# Patient Record
Sex: Male | Born: 1965 | Race: Black or African American | Hispanic: No | Marital: Married | Smoking: Current some day smoker
Health system: Southern US, Community
[De-identification: ages and names within clinical notes are randomized; demographics above are authoritative.]

## PROBLEM LIST (undated history)

## (undated) HISTORY — PX: ORTHOPEDIC SURGERY: SHX850

---

## 2018-02-02 ENCOUNTER — Emergency Department (HOSPITAL_BASED_OUTPATIENT_CLINIC_OR_DEPARTMENT_OTHER)
Admission: EM | Admit: 2018-02-02 | Discharge: 2018-02-02 | Discharge status: Against Medical Advice | Payer: Managed Care, Other (non HMO) | Attending: Emergency Medicine | Admitting: Emergency Medicine

## 2018-02-02 ENCOUNTER — Other Ambulatory Visit: Payer: Self-pay

## 2018-02-02 ENCOUNTER — Encounter (HOSPITAL_BASED_OUTPATIENT_CLINIC_OR_DEPARTMENT_OTHER): Payer: Self-pay | Admitting: Emergency Medicine

## 2018-02-02 ENCOUNTER — Emergency Department (HOSPITAL_BASED_OUTPATIENT_CLINIC_OR_DEPARTMENT_OTHER): Payer: Managed Care, Other (non HMO)

## 2018-02-02 DIAGNOSIS — F17210 Nicotine dependence, cigarettes, uncomplicated: Secondary | ICD-10-CM | POA: Insufficient documentation

## 2018-02-02 DIAGNOSIS — E876 Hypokalemia: Secondary | ICD-10-CM | POA: Diagnosis not present

## 2018-02-02 DIAGNOSIS — I501 Left ventricular failure: Secondary | ICD-10-CM

## 2018-02-02 DIAGNOSIS — R0602 Shortness of breath: Secondary | ICD-10-CM

## 2018-02-02 LAB — BASIC METABOLIC PANEL
Anion gap: 9 (ref 5–15)
BUN: 17 mg/dL (ref 6–20)
CALCIUM: 8.7 mg/dL — AB (ref 8.9–10.3)
CO2: 28 mmol/L (ref 22–32)
CREATININE: 0.93 mg/dL (ref 0.61–1.24)
Chloride: 101 mmol/L (ref 98–111)
GFR calc Af Amer: >60 mL/min (ref >60)
Glucose, Bld: 118 mg/dL — ABNORMAL HIGH (ref 70–99)
Potassium: 2.8 mmol/L — ABNORMAL LOW (ref 3.5–5.1)
Sodium: 138 mmol/L (ref 135–145)

## 2018-02-02 LAB — CBC WITH DIFFERENTIAL/PLATELET
Abs Immature Granulocytes: 0.02 10*3/uL (ref 0.00–0.07)
BASOS ABS: 0.1 10*3/uL (ref 0.0–0.1)
Basophils Relative: 1 %
EOS ABS: 0.2 10*3/uL (ref 0.0–0.5)
Eosinophils Relative: 2 %
HEMATOCRIT: 45.1 % (ref 39.0–52.0)
HEMOGLOBIN: 14.2 g/dL (ref 13.0–17.0)
IMMATURE GRANULOCYTES: 0 %
LYMPHS ABS: 1.4 10*3/uL (ref 0.7–4.0)
LYMPHS PCT: 17 %
MCH: 27 pg (ref 26.0–34.0)
MCHC: 31.5 g/dL (ref 30.0–36.0)
MCV: 85.9 fL (ref 80.0–100.0)
Monocytes Absolute: 0.5 10*3/uL (ref 0.1–1.0)
Monocytes Relative: 6 %
NEUTROS PCT: 74 %
Neutro Abs: 6.1 10*3/uL (ref 1.7–7.7)
PLATELETS: 182 10*3/uL (ref 150–400)
RBC: 5.25 MIL/uL (ref 4.22–5.81)
RDW: 13.8 % (ref 11.5–15.5)
WBC: 8.1 10*3/uL (ref 4.0–10.5)
nRBC: 0 % (ref 0.0–0.2)

## 2018-02-02 LAB — TROPONIN I: Troponin I: 0.04 ng/mL (ref <0.03)

## 2018-02-02 LAB — BRAIN NATRIURETIC PEPTIDE: B Natriuretic Peptide: 307.2 pg/mL — ABNORMAL HIGH (ref 0.0–100.0)

## 2018-02-02 LAB — D-DIMER, QUANTITATIVE: D-Dimer, Quant: 1.13 ug/mL-FEU — ABNORMAL HIGH (ref 0.00–0.50)

## 2018-02-02 MED ORDER — ALBUTEROL SULFATE (2.5 MG/3ML) 0.083% IN NEBU
2.5000 mg | INHALATION_SOLUTION | Freq: Once | RESPIRATORY_TRACT | Status: AC
  Administered 2018-02-02: 2.5 mg via RESPIRATORY_TRACT
  Filled 2018-02-02: qty 3

## 2018-02-02 MED ORDER — IOPAMIDOL (ISOVUE-370) INJECTION 76%: 125.0000 mL | Freq: Once | INTRAVENOUS | Status: DC | PRN

## 2018-02-02 MED ORDER — POTASSIUM CHLORIDE CRYS ER 20 MEQ PO TBCR: 20.0000 meq | EXTENDED_RELEASE_TABLET | Freq: Two times a day (BID) | ORAL | 0 refills | Status: AC

## 2018-02-02 MED ORDER — FUROSEMIDE 20 MG PO TABS: 20.0000 mg | ORAL_TABLET | Freq: Every day | ORAL | 0 refills | Status: AC

## 2018-02-02 MED ORDER — POTASSIUM CHLORIDE 20 MEQ/15ML (10%) PO SOLN
20.0000 meq | Freq: Once | ORAL | Status: AC
  Administered 2018-02-02: 20 meq via ORAL
  Filled 2018-02-02: qty 15

## 2018-02-02 MED ORDER — FUROSEMIDE 10 MG/ML IJ SOLN
40.0000 mg | Freq: Once | INTRAMUSCULAR | Status: AC
  Administered 2018-02-02: 40 mg via INTRAVENOUS
  Filled 2018-02-02: qty 4

## 2018-02-02 MED ORDER — POTASSIUM CHLORIDE CRYS ER 20 MEQ PO TBCR
40.0000 meq | EXTENDED_RELEASE_TABLET | Freq: Once | ORAL | Status: AC
  Administered 2018-02-02: 40 meq via ORAL
  Filled 2018-02-02: qty 2

## 2018-02-02 MED ORDER — IPRATROPIUM-ALBUTEROL 0.5-2.5 (3) MG/3ML IN SOLN
3.0000 mL | RESPIRATORY_TRACT | Status: DC
  Administered 2018-02-02: 3 mL via RESPIRATORY_TRACT
  Filled 2018-02-02: qty 3

## 2018-02-02 NOTE — ED Notes (Signed)
Family very verbally abusive against Licensed conveyancer, RN and RT while triage and while starting treatment. Attempted to address family concern about what is the her needs and how we can satisfied them while we treat the pt, family responded that there is nothing she needs.

## 2018-02-02 NOTE — ED Triage Notes (Signed)
Pt c/o SOB for the past few day and getting worse today, denies any cp at this time.

## 2018-02-02 NOTE — ED Provider Notes (Signed)
[facility] Provider Note: Lowella Dell, MD, FACEP  CSN: [id number] MRN: [id number] ARRIVAL: 02/02/18 at 0252 ROOM: [facility]01/[facility]01   CHIEF COMPLAINT  Shortness of Breath   HISTORY OF PRESENT ILLNESS  02/02/18 3:01 AM Andrew Ortega. is a 52 y.o. male without significant past medical history.  He has had the gradual onset of shortness of breath over the last 2 months that acutely worsened overnight.  Symptoms are now moderate.  He has had associated wheezing but no cough.  He denies chest pain or fever.  Symptoms are not worse with lying supine but are worse with exertion.  He denies lower extremity edema.    History reviewed. No pertinent past medical history.  Past Surgical History:  Procedure Laterality Date  . ORTHOPEDIC SURGERY      History reviewed. No pertinent family history.  Social History   Tobacco Use  . Smoking status: Current Some Day Smoker    Types: Cigars  . Smokeless tobacco: Never Used  Substance Use Topics  . Alcohol use: Never    Frequency: Never  . Drug use: Never    Prior to Admission medications   Not on File    Allergies Shellfish allergy   REVIEW OF SYSTEMS  Negative except as noted here or in the History of Present Illness.   PHYSICAL EXAMINATION  Initial Vital Signs Blood pressure (!) 169/132, temperature 98.1 F (36.7 C), temperature source Oral, resp. rate (!) 21, height 6\' 1"  (1.854 m), weight (!) 144 kg, SpO2 90 %.  Examination General: Well-developed, well-nourished male; appearance consistent with age of record HENT: normocephalic; atraumatic Eyes: pupils equal, round and reactive to light; extraocular muscles intact Neck: supple Heart: regular rate and rhythm Lungs: Mild tachypnea; mild increased work of breathing; faint expiratory wheezes in bases Abdomen: soft; nondistended; nontender; bowel sounds present Extremities: No deformity; full range of motion; pulses normal Neurologic: Awake, alert and  oriented; motor function intact in all extremities and symmetric; no facial droop Skin: Warm and dry Psychiatric: Flat affect   RESULTS  Summary of this visit's results, reviewed by myself:   EKG Interpretation  Date/Time:  Wednesday February 02 2018 03:09:06 EST Ventricular Rate:  96 PR Interval:    QRS Duration: 161 QT Interval:  430 QTC Calculation: 544 R Axis:   -85 Text Interpretation:  Sinus rhythm Atrial premature complex LAE, consider biatrial enlargement Left bundle branch block No previous ECGs available Confirmed by Rocket Gunderson ([id number]) on 02/02/2018 3:26:24 AM       EKG Interpretation  Date/Time:  Wednesday February 02 2018 03:15:38 EST Ventricular Rate:  87 PR Interval:    QRS Duration: 100 QT Interval:  392 QTC Calculation: 472 R Axis:   72 Text Interpretation:  Sinus rhythm Long R-R with ventricular escape Left atrial enlargement Left ventricular hypertrophy Abnormal T, consider ischemia, lateral leads Baseline wander in lead(s) II III aVF\ LBBB no longer present Confirmed by Paula Libra ([id number]) on 02/02/2018 3:27:37 AM       Laboratory Studies: Results for orders placed or performed during the hospital encounter of 02/02/18 (from the past 24 hour(s))  CBC with Differential/Platelet     Status: None   Collection Time: 02/02/18  3:21 AM  Result Value Ref Range   WBC 8.1 4.0 - 10.5 K/uL   RBC 5.25 4.22 - 5.81 MIL/uL   Hemoglobin 14.2 13.0 - 17.0 g/dL   HCT 95.2 84.1 - 32.4 %   MCV 85.9 80.0 - 100.0 fL  MCH 27.0 26.0 - 34.0 pg   MCHC 31.5 30.0 - 36.0 g/dL   RDW 16.1 09.6 - 04.5 %   Platelets 182 150 - 400 K/uL   nRBC 0.0 0.0 - 0.2 %   Neutrophils Relative % 74 %   Neutro Abs 6.1 1.7 - 7.7 K/uL   Lymphocytes Relative 17 %   Lymphs Abs 1.4 0.7 - 4.0 K/uL   Monocytes Relative 6 %   Monocytes Absolute 0.5 0.1 - 1.0 K/uL   Eosinophils Relative 2 %   Eosinophils Absolute 0.2 0.0 - 0.5 K/uL   Basophils Relative 1 %   Basophils Absolute 0.1 0.0 - 0.1  K/uL   Immature Granulocytes 0 %   Abs Immature Granulocytes 0.02 0.00 - 0.07 K/uL  Basic metabolic panel     Status: Abnormal   Collection Time: 02/02/18  3:21 AM  Result Value Ref Range   Sodium 138 135 - 145 mmol/L   Potassium 2.8 (L) 3.5 - 5.1 mmol/L   Chloride 101 98 - 111 mmol/L   CO2 28 22 - 32 mmol/L   Glucose, Bld 118 (H) 70 - 99 mg/dL   BUN 17 6 - 20 mg/dL   Creatinine, Ser 4.09 0.61 - 1.24 mg/dL   Calcium 8.7 (L) 8.9 - 10.3 mg/dL   GFR calc non Af Amer >60 >60 mL/min   GFR calc Af Amer >60 >60 mL/min   Anion gap 9 5 - 15  Troponin I - ONCE - STAT     Status: Abnormal   Collection Time: 02/02/18  3:21 AM  Result Value Ref Range   Troponin I 0.04 (HH) <0.03 ng/mL  Brain natriuretic peptide     Status: Abnormal   Collection Time: 02/02/18  3:21 AM  Result Value Ref Range   B Natriuretic Peptide 307.2 (H) 0.0 - 100.0 pg/mL  D-dimer, quantitative (not at [facility])     Status: Abnormal   Collection Time: 02/02/18  3:21 AM  Result Value Ref Range   D-Dimer, Quant 1.13 (H) 0.00 - 0.50 ug/mL-FEU   Imaging Studies: Dg Chest 2 View  Result Date: 02/02/2018 CLINICAL DATA:  Shortness of breath for several days. EXAM: CHEST - 2 VIEW COMPARISON:  None. FINDINGS: Borderline cardiomegaly. Mild to moderate pulmonary edema. No focal airspace disease. Possible tiny pleural effusions. No pneumothorax. No acute osseous abnormalities. IMPRESSION: Borderline cardiomegaly with mild to moderate pulmonary edema. Possible tiny pleural effusions. Electronically Signed   By: Narda Rutherford M.D.   On: 02/02/2018 03:46    ED COURSE and MDM  Nursing notes and initial vitals signs, including pulse oximetry, reviewed.  Vitals:   02/02/18 0258 02/02/18 0259 02/02/18 0301 02/02/18 0318  BP:   (!) 169/132   Resp:   (!) 21   Temp:   98.1 F (36.7 C)   TempSrc:   Oral   SpO2:   90% 93%  Weight:  (!) 144 kg    Height: 6\' 1"  (1.854 m)      4:10 AM Patient feels somewhat better after albuterol and  Atrovent neb treatment.  Chest x-ray shows pulmonary edema which correlates with elevated BNP.  Elevated troponin may be due to CHF.  CT angio chest pending for elevated d-dimer in the setting of shortness of breath and recent air travel.  4:55 AM Patient refused CT angio chest.  Admission for further work-up was advised but patient has elected to leave AGAINST MEDICAL ADVICE.  He states he will follow-up with his primary care  physician in [place]; he recently moved here from [place].  He was advised that he is free to return whenever he chooses.  We will start him on Lasix and potassium supplementation.  He was given Lasix 40 mg IV in the ED with brisk diuresis.  PROCEDURES    ED DIAGNOSES     ICD-10-CM   1. Shortness of breath R06.02   2. Pulmonary edema cardiac cause (HCC) I50.1   3. Hypokalemia E87.6        Andrew Ortega, Andrew Ruiz, MD 02/02/18 [phone]

## 2018-02-02 NOTE — ED Notes (Signed)
Attempted multiple time to explain pt about his condition and the need of treatment, explained all the procedures and treatments given by EDP on the ED. From the beginning pt's wife was upset about any procedure that we completed on the pt's care and encouraged pt to leave AMA, per wife they need a second opinion since she doesn't trust on the care given.  Pt refuses to get CT angio completed and refuses further treatment. Pt verbalized understanding that he is leaving against medical advise. Dr. Read Drivers and Caryl Asp CN

## 2019-04-03 IMAGING — DX DG CHEST 2V
2 series · 2 of 2 positions shown · non-contrast
Comparison: None.

CLINICAL DATA: Shortness of breath for several days.

EXAM:
CHEST - 2 VIEW

[chest pa]
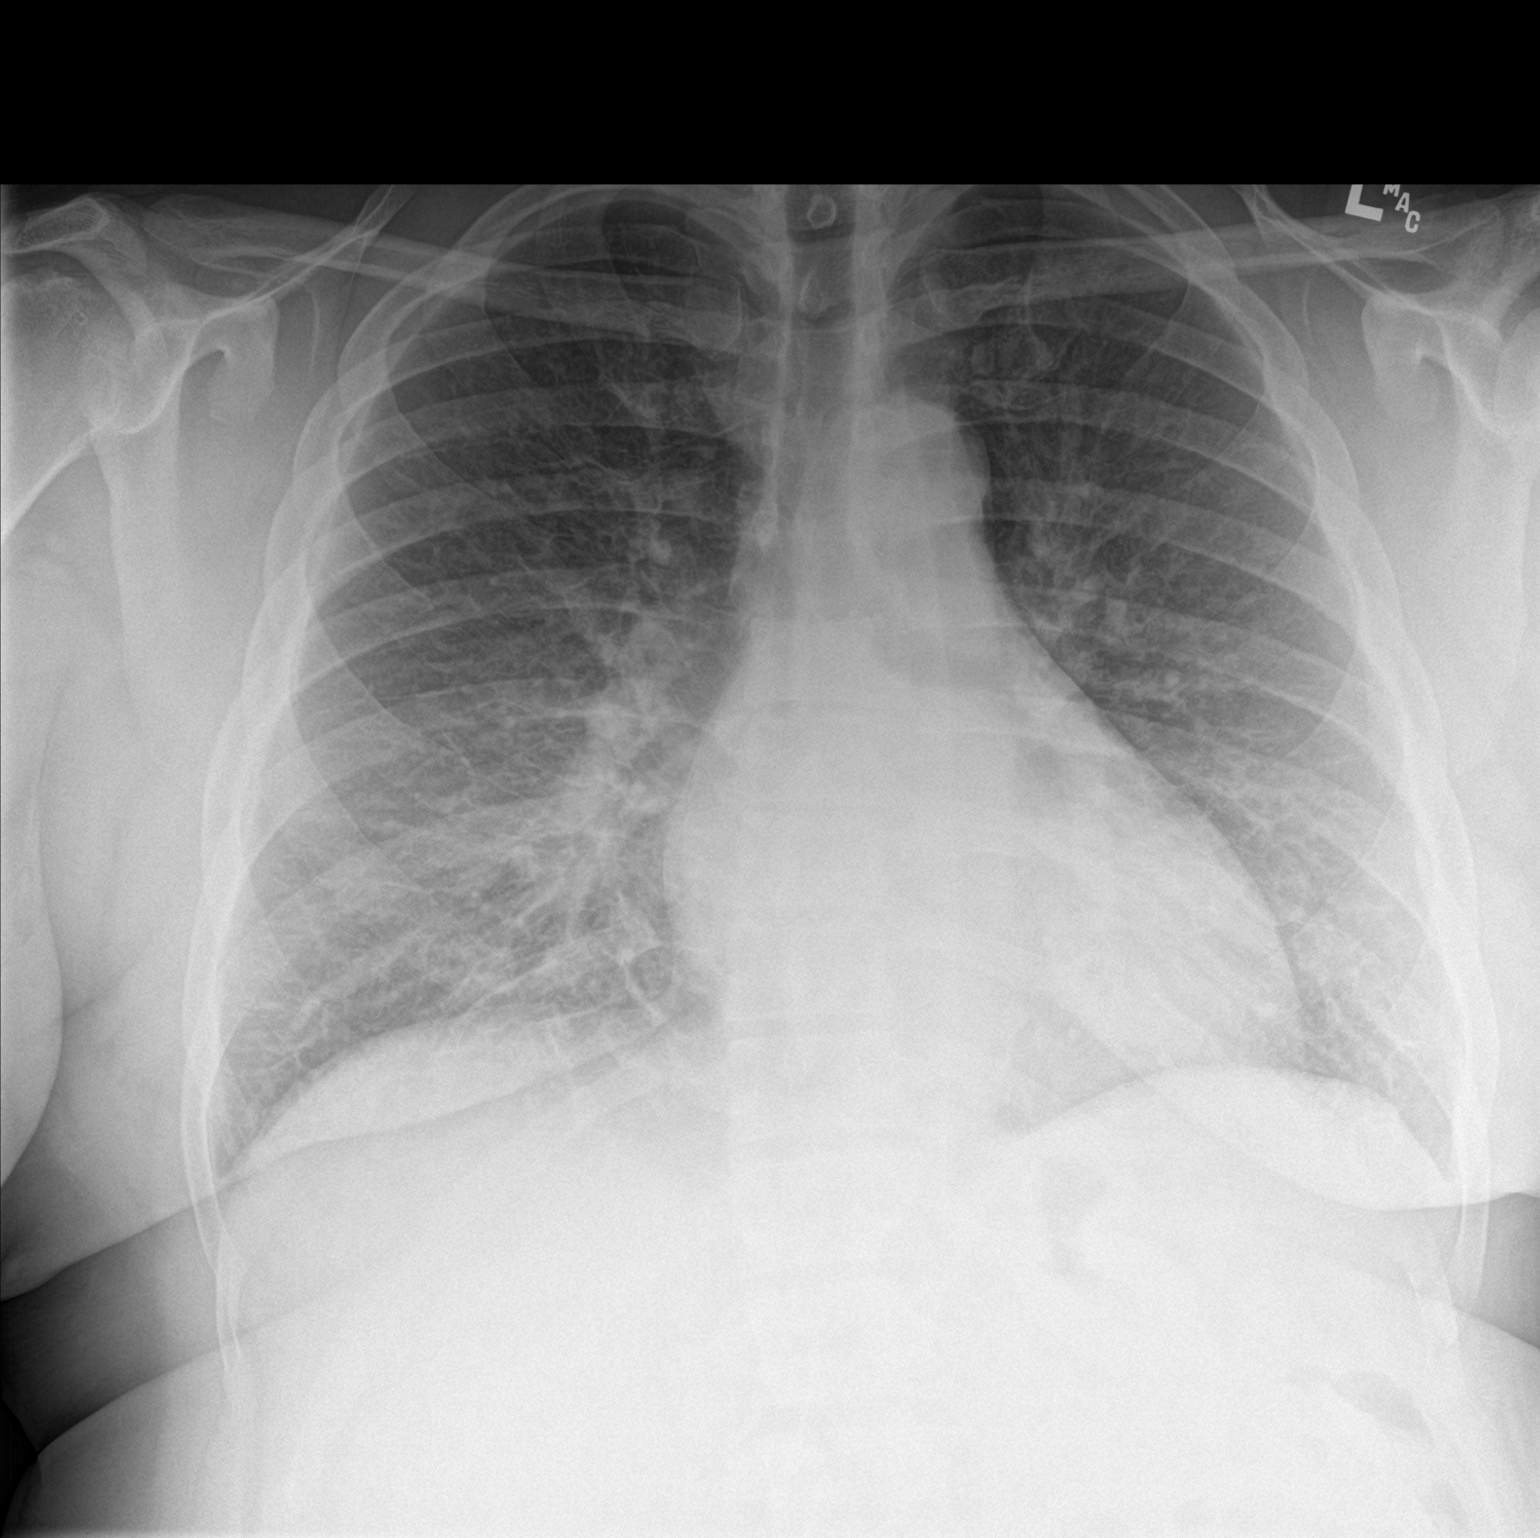

[chest lat]
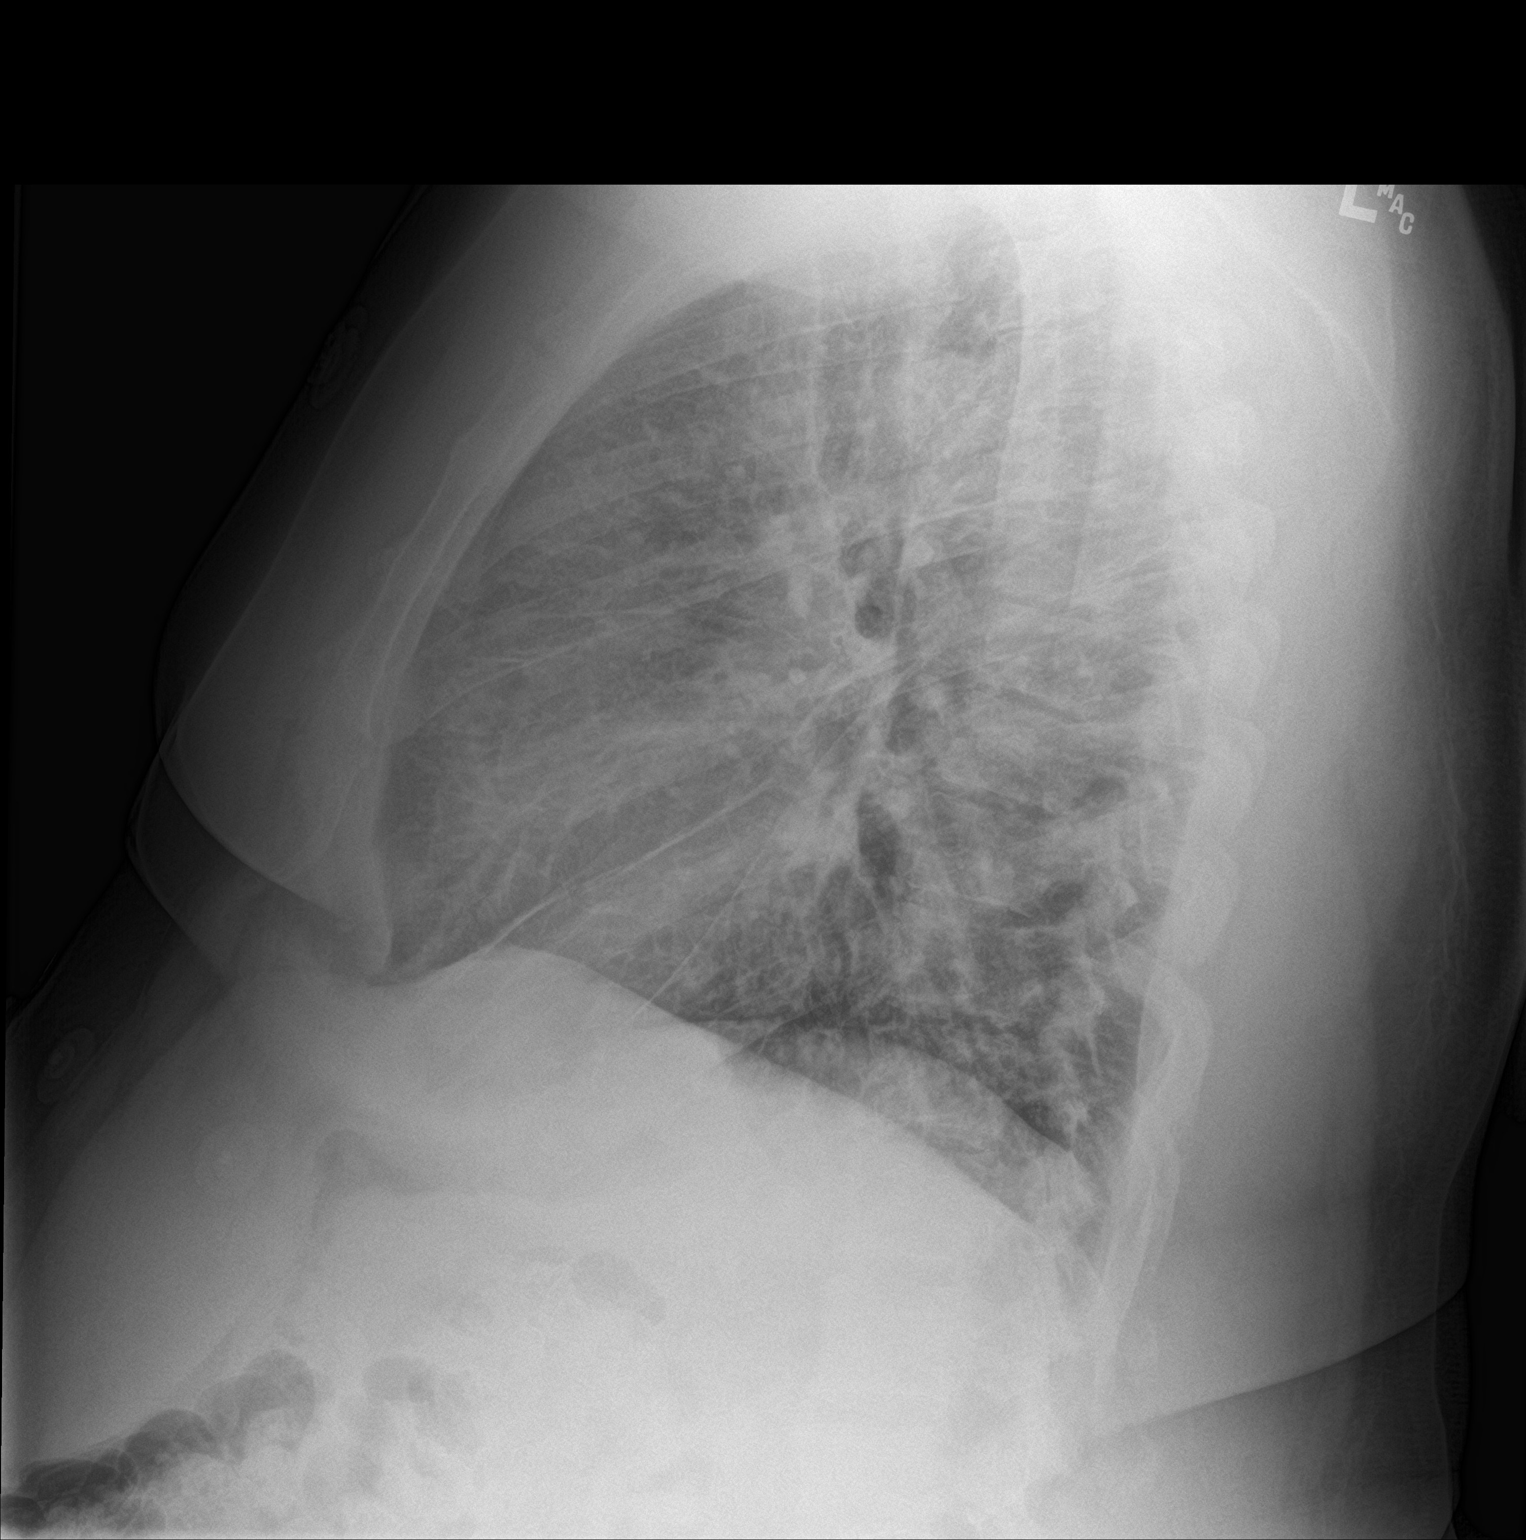

[2 of 2 positions shown; findings below may reference images not displayed]

FINDINGS: Borderline cardiomegaly. Mild to moderate pulmonary edema. No focal
airspace disease. Possible tiny pleural effusions. No pneumothorax.
No acute osseous abnormalities.
IMPRESSION: Borderline cardiomegaly with mild to moderate pulmonary edema.
Possible tiny pleural effusions.

## 2019-07-31 NOTE — Progress Notes (Deleted)
    Patient referred by Jilda Panda, MD for ***  Subjective:   Andrew Ortega., male    DOB: May 08, 1965, 54 y.o.   MRN: [id number]  *** No chief complaint on file.   *** HPI  54 y.o. *** male with ***  *** No past medical history on file.  *** Past Surgical History:  Procedure Laterality Date  . ORTHOPEDIC SURGERY      *** Social History   Tobacco Use  Smoking Status Current Some Day Smoker  . Types: Cigars  Smokeless Tobacco Never Used    Social History   Substance and Sexual Activity  Alcohol Use Never    *** No family history on file.  *** Current Outpatient Medications on File Prior to Visit  Medication Sig Dispense Refill  . furosemide (LASIX) 20 MG tablet Take 1 tablet (20 mg total) by mouth daily. 7 tablet 0  . potassium chloride SA (K-DUR,KLOR-CON) 20 MEQ tablet Take 1 tablet (20 mEq total) by mouth 2 (two) times daily. 14 tablet 0   No current facility-administered medications on file prior to visit.    Cardiovascular and other pertinent studies: ***  EKG ***/***/202***: ***  EKG 07/17/2019: Sinus rhythm. Non-Specific T changes.  ***Check last page of PCP note.  EKG 02/02/18: Sinus rhythm Long R-R with ventricular escape Left atrial enlargement Left ventricular hypertrophy Abnormal T, consider ischemia, lateral leads Baseline wander in lead(s) II III aVF LBBB no longer present.  Chest X-Ray: 02/02/2018: Borderline cardiomegaly with mild to moderate pulmonary edema. Possible tiny pleural effusions.  *** Recent labs: 07/17/2019: Glucose 94, BUN/Cr 16/0.9. EGFR 108. Na/K 139/3.7. Rest of the CMP normal H/H 14.6/42.5. MCV 83.7. Platelets 194. Chol 152, TG 55, HDL 44, LDL 97.  02/02/2018: Glucose 118, BUN/Cr 17/0.9. EGFR 60. Na/K 138/2.8. Ca 8.7. Rest of the CMP normal H/H 14.2/45. MCV 85.9. Platelets 182.    *** Review of Systems  Cardiovascular: Negative for chest pain, dyspnea on exertion, leg swelling, palpitations  and syncope.        *** There were no vitals filed for this visit.   There is no height or weight on file to calculate BMI. There were no vitals filed for this visit.  *** Objective:   Physical Exam  Constitutional: No distress.  Neck: No JVD present.  Cardiovascular: Normal rate, regular rhythm, normal heart sounds and intact distal pulses.  No murmur heard. Pulmonary/Chest: Effort normal and breath sounds normal. He has no wheezes. He has no rales.  Musculoskeletal:        General: No edema.  Nursing note and vitals reviewed.     ***     Assessment & Recommendations:   54 y.o. African American male with ***   ***  Follow up in ***    Thank you for referring the patient to [place]. Please feel free to contact with any questions.  Nigel Mormon, MD [facility] Pager: [phone] Office: [phone]

## 2019-08-02 ENCOUNTER — Ambulatory Visit: Payer: Managed Care, Other (non HMO) | Admitting: Cardiology
